# Patient Record
Sex: Male | Born: 1965 | Race: White | Hispanic: No | Smoking: Former smoker
Health system: Southern US, Community
[De-identification: ages and names within clinical notes are randomized; demographics above are authoritative.]

## PROBLEM LIST (undated history)

## (undated) DIAGNOSIS — E7800 Pure hypercholesterolemia, unspecified: Secondary | ICD-10-CM

## (undated) HISTORY — PX: BACK SURGERY: SHX140

---

## 2009-08-15 ENCOUNTER — Ambulatory Visit (HOSPITAL_COMMUNITY): Admission: RE | Admit: 2009-08-15 | Discharge: 2009-08-15 | Payer: Self-pay | Admitting: Family Medicine

## 2010-01-26 ENCOUNTER — Emergency Department (HOSPITAL_COMMUNITY): Admission: EM | Admit: 2010-01-26 | Discharge: 2010-01-26 | Payer: Self-pay | Admitting: Emergency Medicine

## 2014-10-04 ENCOUNTER — Other Ambulatory Visit: Payer: Self-pay | Admitting: Neurosurgery

## 2014-10-04 DIAGNOSIS — M48061 Spinal stenosis, lumbar region without neurogenic claudication: Secondary | ICD-10-CM

## 2014-10-13 ENCOUNTER — Ambulatory Visit
Admission: RE | Admit: 2014-10-13 | Discharge: 2014-10-13 | Disposition: A | Discharge status: Home / Self Care | Payer: Worker's Compensation | Source: Ambulatory Visit | Attending: Neurosurgery | Admitting: Neurosurgery

## 2014-10-13 ENCOUNTER — Ambulatory Visit
Admission: RE | Admit: 2014-10-13 | Discharge: 2014-10-13 | Disposition: A | Discharge status: Home / Self Care | Payer: Self-pay | Source: Ambulatory Visit | Attending: Neurosurgery | Admitting: Neurosurgery

## 2014-10-13 DIAGNOSIS — M48061 Spinal stenosis, lumbar region without neurogenic claudication: Secondary | ICD-10-CM

## 2014-10-13 MED ORDER — ONDANSETRON HCL 4 MG/2ML IJ SOLN
4.0000 mg | Freq: Four times a day (QID) | INTRAMUSCULAR | Status: DC | PRN
Start: 2014-10-13 — End: 2014-10-14

## 2014-10-13 MED ORDER — IOHEXOL 180 MG/ML  SOLN
20.0000 mL | Freq: Once | INTRAMUSCULAR | Status: AC | PRN
  Administered 2014-10-13: 20 mL via INTRATHECAL

## 2014-10-13 MED ORDER — DIAZEPAM 5 MG PO TABS
5.0000 mg | ORAL_TABLET | Freq: Once | ORAL | Status: AC
  Administered 2014-10-13: 5 mg via ORAL

## 2014-10-13 MED ORDER — DIAZEPAM 5 MG PO TABS
10.0000 mg | ORAL_TABLET | Freq: Once | ORAL | Status: AC
  Administered 2014-10-13: 5 mg via ORAL

## 2014-10-13 NOTE — Discharge Instructions (Signed)

## 2016-09-21 IMAGING — CT CT L SPINE W/ CM
4 of 11 series · 10 of 33 positions shown, 12 images · non-contrast
Comparison: Lumbar spine MRI 06/06/2014

CLINICAL DATA: Lumbar spinal stenosis. Right greater than left
lateral leg pain.
TECHNIQUE: Contiguous axial images were obtained through the Lumbar spine after
the intrathecal infusion of contrast. Coronal and sagittal
reconstructions were obtained of the axial image sets.

[Series 4: l spine bone · axial · 0.28mm/px · z∈[-59,+13]mm · 2 of 87 slices shown, 3 images]
[im 29/87  soft-tissue]
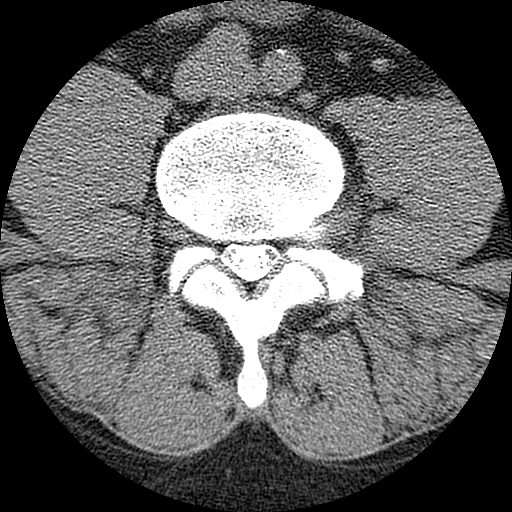
[im 29/87  bone]
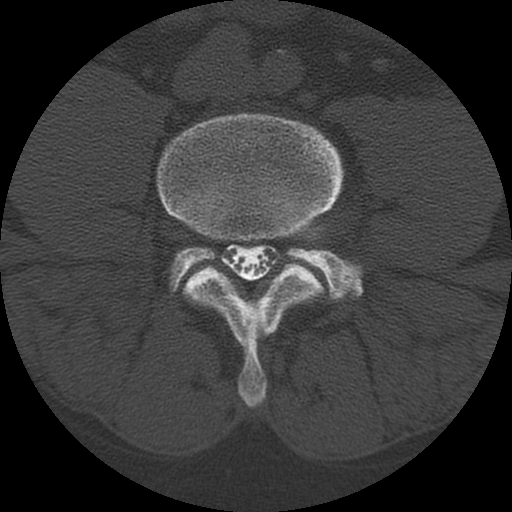
[im 58/87  bone]
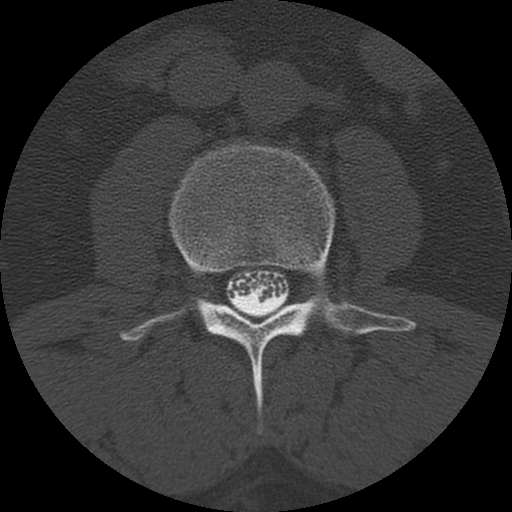

[Series 5: l spine detail · axial · 0.28mm/px · z∈[-59,+13]mm · 2 of 87 slices shown]
[im 29/87  bone]
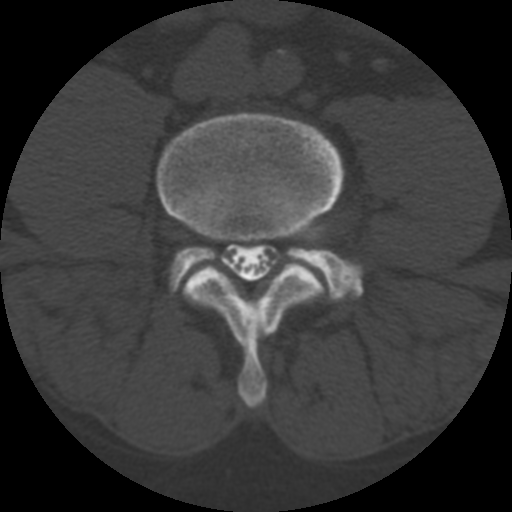
[im 58/87  bone]
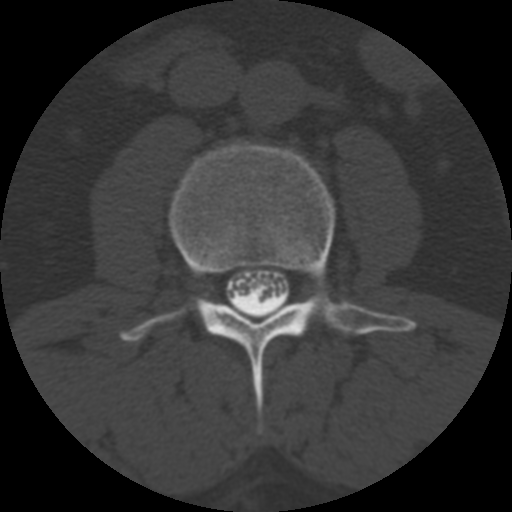

[Series 201: cor 2 · coronal · 0.43mm/px · 1 of 62 slices shown]
[im 31/62  bone]
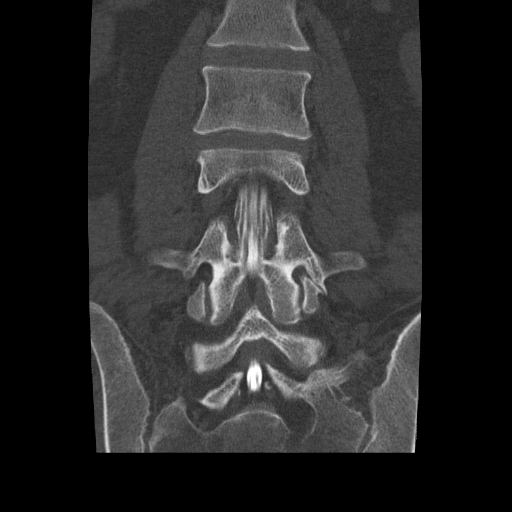

[Series 202: sag · sagittal · 0.43mm/px · 5 of 65 slices shown, 6 images]
[im 22/65  bone]
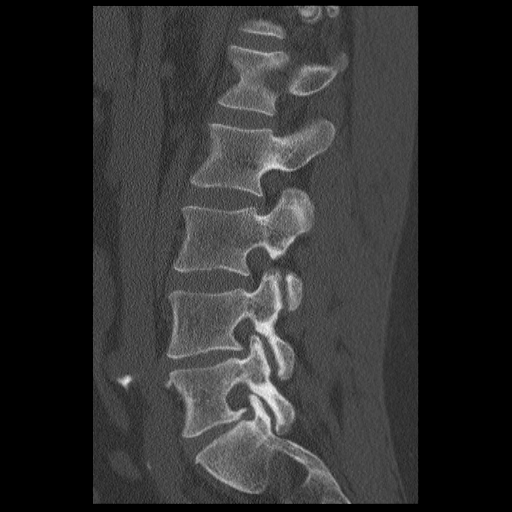
[im 27/65  bone]
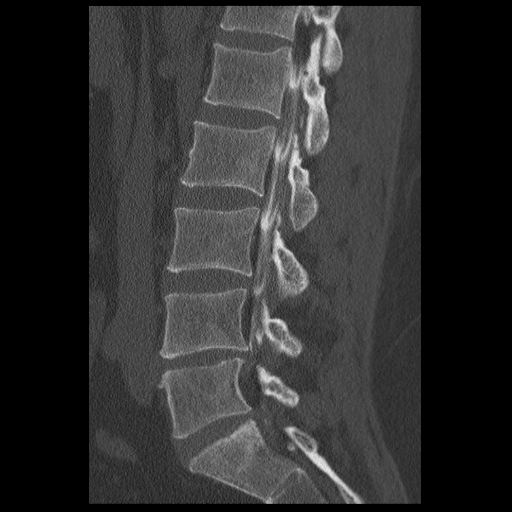
[im 33/65  soft-tissue]
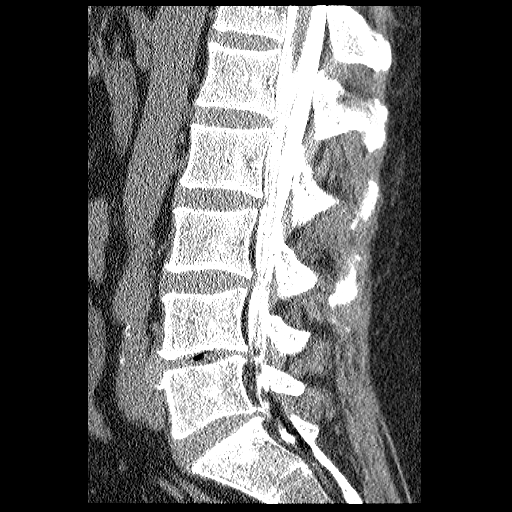
[im 33/65  bone]
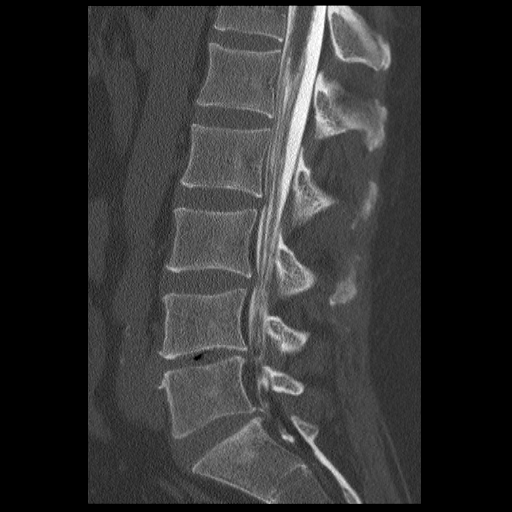
[im 38/65  bone]
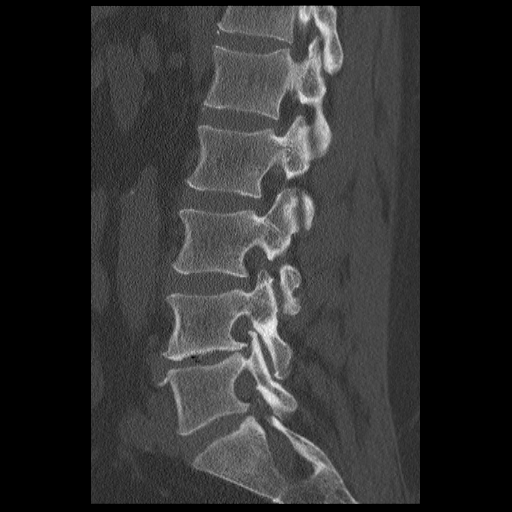
[im 43/65  bone]
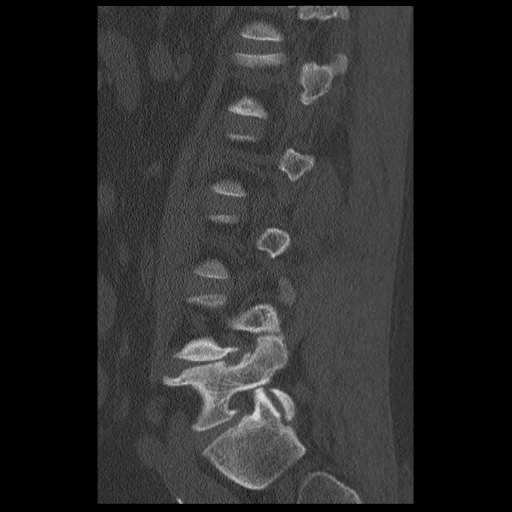

[10 of 33 positions shown; findings below may reference images not displayed]

EXAM:
LUMBAR MYELOGRAM

FLUOROSCOPY TIME:  Radiation Exposure Index (as provided by the
fluoroscopic device): 73 dGy*cm2

Fluoroscopy Time (in minutes and seconds):  1 minutes 30 seconds

PROCEDURE:
After thorough discussion of risks and benefits of the procedure
including bleeding, infection, injury to nerves, blood vessels,
adjacent structures as well as headache and CSF leak, written and
oral informed consent was obtained. Consent was obtained by Dr.
Chon Delorenzo. Time out form was completed.

Patient was positioned prone on the fluoroscopy table. Local
anesthesia was provided with 1% lidocaine without epinephrine after
prepped and draped in the usual sterile fashion. Puncture was
performed at L2-3 using a 3 1/2 inch 22-gauge spinal needle via a
right paramedian approach. Using a single pass through the dura, the
needle was placed within the thecal sac, with return of clear CSF.
15 mL of Mmnipaque-[id number] was injected into the thecal sac, with normal
opacification of the nerve roots and cauda equina consistent with
free flow within the subarachnoid space.

I personally performed the lumbar puncture and administered the
intrathecal contrast. I also personally supervised acquisition of
the myelogram images.
FINDINGS: LUMBAR MYELOGRAM FINDINGS:

Vertebral alignment is within normal limits without evidence of
significant listhesis with neutral, flexion, or extension imaging.
Vertebral body heights are preserved. There is moderate disc space
narrowing at L4-5, with mild narrowing at L5-S1. Small ventral
extradural defects at L3-4 and L4-5 result in mild spinal canal
narrowing. There is a small ventral extradural defect at L2-3
without evidence of significant stenosis. These do not significantly
change with standing.

CT LUMBAR MYELOGRAM FINDINGS:

There is minimal right convex curvature of the lower lumbar spine.
Trace retrolisthesis of L4 on L5 measures 3 mm, unchanged. Vertebral
body heights are preserved. Disc space narrowing is moderate at L4-5
and mild at L5-S1. Mild vacuum disc phenomenon is noted at L4-5.
Conus medullaris terminates at L1. Mild aortoiliac atherosclerotic
calcification is noted.

T12-L1 and L1-2:  Negative.

L2-3:  Minimal disc bulging without stenosis, unchanged.

L3-4: Small left central disc protrusion and mild facet and
ligamentum flavum hypertrophy result in minimal left lateral recess
and borderline spinal stenosis without neural foraminal stenosis,
unchanged.

L4-5: Disc space height loss, moderate circumferential disc bulging,
endplate spurring, and mild facet and ligamentum flavum hypertrophy
result in mild-to-moderate bilateral lateral recess stenosis, mild
spinal stenosis, and mild right and moderate left neural foraminal
stenosis, unchanged. The L5 nerve roots could be affected in the
lateral recesses.

L5-S1: Disc space height loss, mild disc bulging, and endplate
spurring result in mild bilateral neural foraminal stenosis without
spinal stenosis, unchanged. Bulging disc may contact the S1 nerve
roots in the lateral recesses without evidence of impingement.
IMPRESSION: 1. Moderate disc degeneration at L4-5 resulting in mild-to-moderate
lateral recess and neural foraminal stenosis and mild spinal
stenosis. The L5 nerves may be affected in the lateral recesses.
2. Mild bilateral neural foraminal stenosis at L5-S1 due to disc
degeneration.
3. Small disc protrusion at L3-4 without significant stenosis.

## 2019-03-31 ENCOUNTER — Other Ambulatory Visit: Payer: Self-pay

## 2019-03-31 ENCOUNTER — Ambulatory Visit
Admission: EM | Admit: 2019-03-31 | Discharge: 2019-03-31 | Disposition: A | Discharge status: Home / Self Care | Payer: Managed Care, Other (non HMO) | Attending: Emergency Medicine | Admitting: Emergency Medicine

## 2019-03-31 DIAGNOSIS — R519 Headache, unspecified: Secondary | ICD-10-CM

## 2019-03-31 DIAGNOSIS — R51 Headache: Secondary | ICD-10-CM

## 2019-03-31 MED ORDER — DEXAMETHASONE SODIUM PHOSPHATE 10 MG/ML IJ SOLN
10.0000 mg | Freq: Once | INTRAMUSCULAR | Status: AC
  Administered 2019-03-31: 10 mg via INTRAMUSCULAR

## 2019-03-31 MED ORDER — KETOROLAC TROMETHAMINE 60 MG/2ML IM SOLN
60.0000 mg | Freq: Once | INTRAMUSCULAR | Status: AC
  Administered 2019-03-31: 60 mg via INTRAMUSCULAR

## 2019-03-31 MED ORDER — ONDANSETRON 4 MG PO TBDP
4.0000 mg | ORAL_TABLET | Freq: Once | ORAL | Status: AC
  Administered 2019-03-31: 4 mg via ORAL

## 2019-03-31 NOTE — ED Provider Notes (Signed)
[facility]   OU:[id number] 03/31/19 Arrival Time: 51  CC: HEADACHE  SUBJECTIVE:  Gordon Herrera is a 53 y.o. male who complains of intermittent headaches for the past 2 weeks.  Denies a precipitating event, or recent head trauma.  Denies sick exposure to COVID, flu or strep.  Does admit to traveling by truck to [place] last week.  States he needs to update his eye glasses prescription, and has recently quit smoking cigarettes last week. Saw dentist and has sinus scan that were negative.  Patient localizes pain to the behind RT eye.  Describes the pain as intermittent and pulsating in character.  Pain is 9/10.  Patient has tried OTC advil with relief. Denies aggravating factors.  States headaches are more frequent than they have been in the past. Patient denies fever, chills, nausea, vomiting, nasal congestion, rhinorrhea, chest pain, cough, SOB, abdominal pain, weakness, numbness or tingling, slurred speech.    ROS: As per HPI.  All other pertinent ROS negative.     History reviewed. No pertinent past medical history. Past Surgical History:  Procedure Laterality Date  . BACK SURGERY     No Known Allergies No current facility-administered medications on file prior to encounter.    No current outpatient medications on file prior to encounter.   Social History   Socioeconomic History  . Marital status: Divorced    Spouse name: Not on file  . Number of children: Not on file  . Years of education: Not on file  . Highest education level: Not on file  Occupational History  . Not on file  Social Needs  . Financial resource strain: Not on file  . Food insecurity    Worry: Not on file    Inability: Not on file  . Transportation needs    Medical: Not on file    Non-medical: Not on file  Tobacco Use  . Smoking status: Former Research scientist (life sciences)  . Smokeless tobacco: Never Used  Substance and Sexual Activity  . Alcohol use: Not Currently  . Drug use: Never  . Sexual activity: Not  on file  Lifestyle  . Physical activity    Days per week: Not on file    Minutes per session: Not on file  . Stress: Not on file  Relationships  . Social Herbalist on phone: Not on file    Gets together: Not on file    Attends religious service: Not on file    Active member of club or organization: Not on file    Attends meetings of clubs or organizations: Not on file    Relationship status: Not on file  . Intimate partner violence    Fear of current or ex partner: Not on file    Emotionally abused: Not on file    Physically abused: Not on file    Forced sexual activity: Not on file  Other Topics Concern  . Not on file  Social History Narrative  . Not on file   No family history on file.  OBJECTIVE:  Vitals:   03/31/19 1337  BP: 124/86  Pulse: 74  Resp: 18  Temp: 98.5 F (36.9 C)  SpO2: 96%    General appearance: alert; no distress Eyes: PERRLA; EOMI HENT: normocephalic; atraumatic Neck: supple with FROM Lungs: clear to auscultation bilaterally Heart: regular rate and rhythm.  Radial pulses 2+ symmetrical bilaterally Extremities: no edema; symmetrical with no gross deformities Skin: warm and dry Neurologic: CN 2-12 grossly intact; finger  to nose without difficulty; normal gait; strength and sensation intact bilaterally about the upper and lower extremities; negative pronator drift Psychological: alert and cooperative; normal mood and affect   ASSESSMENT & PLAN:  1. Acute nonintractable headache, unspecified headache type     Meds ordered this encounter  Medications  . ketorolac (TORADOL) injection 60 mg  . dexamethasone (DECADRON) injection 10 mg  . ondansetron (ZOFRAN-ODT) disintegrating tablet 4 mg    Migraine cocktail given in office Rest and drink plenty of fluids Use OTC medications as needed for symptomatic relief Follow up with PCP to establish care and to reevaluate headaches at that time Return or go to the ER if you have any new or  worsening symptoms such as fever, chills, nausea, vomiting, chest pain, shortness of breath, cough, vision changes, worsening headache despite treatment, slurred speech, facial asymmetry, weakness in arms or legs, etc...  Reviewed expectations re: course of current medical issues. Questions answered. Outlined signs and symptoms indicating need for more acute intervention. Patient verbalized understanding. After Visit Summary given.   Lestine Box, PA-C 03/31/19 1411

## 2019-03-31 NOTE — ED Triage Notes (Signed)
Pt has had sever headaches off and on for past 2 weeks, just left dentist where he had sinus scan and to check implants

## 2019-03-31 NOTE — Discharge Instructions (Signed)
Migraine cocktail given in office Rest and drink plenty of fluids Use OTC medications as needed for symptomatic relief Follow up with PCP to establish care and to reevaluate headaches at that time Return or go to the ER if you have any new or worsening symptoms such as fever, chills, nausea, vomiting, chest pain, shortness of breath, cough, vision changes, worsening headache despite treatment, slurred speech, facial asymmetry, weakness in arms or legs, etc..Marland Kitchen

## 2019-04-21 ENCOUNTER — Encounter: Payer: Self-pay | Admitting: *Deleted

## 2019-05-07 ENCOUNTER — Other Ambulatory Visit: Payer: Self-pay | Admitting: *Deleted

## 2019-05-07 ENCOUNTER — Other Ambulatory Visit: Payer: Self-pay

## 2019-05-07 ENCOUNTER — Ambulatory Visit (INDEPENDENT_AMBULATORY_CARE_PROVIDER_SITE_OTHER): Payer: Managed Care, Other (non HMO) | Admitting: *Deleted

## 2019-05-07 DIAGNOSIS — Z1211 Encounter for screening for malignant neoplasm of colon: Secondary | ICD-10-CM

## 2019-05-07 MED ORDER — NA SULFATE-K SULFATE-MG SULF 17.5-3.13-1.6 GM/177ML PO SOLN: 1.0000 | Freq: Once | ORAL | 0 refills | Status: AC

## 2019-05-07 NOTE — Patient Instructions (Signed)
Gordon Herrera  [birth year] MRN: [id number]     Procedure Date: 08/03/2019 Time to register: 1:00 pm Place to register: [facility] Procedure Time: 2:00 pm Scheduled provider: Dr. Oneida Alar    PREPARATION FOR COLONOSCOPY WITH SUPREP BOWEL PREP KIT  Note: Suprep Bowel Prep Kit is a split-dose (2day) regimen. Consumption of BOTH 6-ounce bottles is required for a complete prep.  Please notify us immediately if you are diabetic, take iron supplements, or if you are on Coumadin or any other blood thinners.                                                                                                                                               1 DAY BEFORE PROCEDURE:  DATE: 08/02/2019  DAY: Sunday Continue clear liquids the entire day - NO SOLID FOOD.    At 6:00pm: Complete steps 1 through 4 below, using ONE (1) 6-ounce bottle, before going to bed. Step 1:  Pour ONE (1) 6-ounce bottle of SUPREP liquid into the mixing container.  Step 2:  Add cool drinking water to the 16 ounce line on the container and mix.  Note: Dilute the solution concentrate as directed prior to use. Step 3:  DRINK ALL the liquid in the container. Step 4:  You MUST drink an additional two (2) or more 16 ounce containers of water over the next one (1) hour.   Continue clear liquids.  DAY OF PROCEDURE:   DATE: 08/03/2019   DAY: Monday If you take medications for your heart, blood pressure, or breathing, you may take these medications.    5 hours before your procedure at :  9:00 am Step 1:  Pour ONE (1) 6-ounce bottle of SUPREP liquid into the mixing container.  Step 2:  Add cool drinking water to the 16 ounce line on the container and mix.  Note: Dilute the solution concentrate as directed prior to use. Step 3:  DRINK ALL the liquid in the container. Step 4:  You MUST drink an additional two (2) or more 16 ounce containers of water over the next one (1) hour. You MUST complete the final glass of water at  least 3 hours before your colonoscopy. Nothing by mouth past 11:00 am  You may take your morning medications with sip of water unless we have instructed otherwise.    Please see below for Dietary Information.  CLEAR LIQUIDS INCLUDE:  Water Jello (NOT red in color)   Ice Popsicles (NOT red in color)   Tea (sugar ok, no milk/cream) Powdered fruit flavored drinks  Coffee (sugar ok, no milk/cream) Gatorade/ Lemonade/ Kool-Aid  (NOT red in color)   Juice: apple, white grape, white cranberry Soft drinks  Clear bullion, consomme, broth (fat free beef/chicken/vegetable)  Carbonated beverages (any kind)  Strained chicken noodle soup Hard Candy   Remember: Clear liquids are  liquids that will allow you to see your fingers on the other side of a clear glass. Be sure liquids are NOT red in color, and not cloudy, but CLEAR.  DO NOT EAT OR DRINK ANY OF THE FOLLOWING:  Dairy products of any kind   Cranberry juice Tomato juice / V8 juice   Grapefruit juice Orange juice     Red grape juice  Do not eat any solid foods, including such foods as: cereal, oatmeal, yogurt, fruits, vegetables, creamed soups, eggs, bread, crackers, pureed foods in a blender, etc.   HELPFUL HINTS FOR DRINKING PREP SOLUTION:   Make sure prep is extremely cold. Mix and refrigerate the the morning of the prep. You may also put in the freezer.   You may try mixing some Crystal Light or Country Time Lemonade if you prefer. Mix in small amounts; add more if necessary.  Try drinking through a straw  Rinse mouth with water or a mouthwash between glasses, to remove after-taste.  Try sipping on a cold beverage /ice/ popsicles between glasses of prep.  Place a piece of sugar-free hard candy in mouth between glasses.  If you become nauseated, try consuming smaller amounts, or stretch out the time between glasses. Stop for 30-60 minutes, then slowly start back drinking.     OTHER INSTRUCTIONS  You will need a responsible  adult at least 52 years of age to accompany you and drive you home. This person must remain in the waiting room during your procedure. The hospital will cancel your procedure if you do not have a responsible adult with you.   1. Wear loose fitting clothing that is easily removed. 2. Leave jewelry and other valuables at home.  3. Remove all body piercing jewelry and leave at home. 4. Total time from sign-in until discharge is approximately 2-3 hours. 5. You should go home directly after your procedure and rest. You can resume normal activities the day after your procedure. 6. The day of your procedure you should not:  Drive  Make legal decisions  Operate machinery  Drink alcohol  Return to work   You may call the office (Dept: [phone]) before 5:00pm, or page the doctor on call [phone]) after 5:00pm, for further instructions, if necessary.   Insurance Information YOU WILL NEED TO CHECK WITH YOUR INSURANCE COMPANY FOR THE BENEFITS OF COVERAGE YOU HAVE FOR THIS PROCEDURE.  UNFORTUNATELY, NOT ALL INSURANCE COMPANIES HAVE BENEFITS TO COVER ALL OR PART OF THESE TYPES OF PROCEDURES.  IT IS YOUR RESPONSIBILITY TO CHECK YOUR BENEFITS, HOWEVER, WE WILL BE GLAD TO ASSIST YOU WITH ANY CODES YOUR INSURANCE COMPANY MAY NEED.    PLEASE NOTE THAT MOST INSURANCE COMPANIES WILL NOT COVER A SCREENING COLONOSCOPY FOR PEOPLE UNDER THE AGE OF 50  IF YOU HAVE BCBS INSURANCE, YOU MAY HAVE BENEFITS FOR A SCREENING COLONOSCOPY BUT IF POLYPS ARE FOUND THE DIAGNOSIS WILL CHANGE AND THEN YOU MAY HAVE A DEDUCTIBLE THAT WILL NEED TO BE MET. SO PLEASE MAKE SURE YOU CHECK YOUR BENEFITS FOR A SCREENING COLONOSCOPY AS WELL AS A DIAGNOSTIC COLONOSCOPY.

## 2019-05-07 NOTE — Progress Notes (Addendum)
Gastroenterology Pre-Procedure Review  Request Date: 05/07/2019 Requesting Physician: Dr. Gerarda Fraction, no previous TCS  PATIENT REVIEW QUESTIONS: The patient responded to the following health history questions as indicated:    1. Diabetes Melitis: no 2. Joint replacements in the past 12 months: no 3. Major health problems in the past 3 months: yes, migraines that are being managed by PCP 4. Has an artificial valve or MVP: no 5. Has a defibrillator: no 6. Has been advised in past to take antibiotics in advance of a procedure like teeth cleaning: no 7. Family history of colon cancer: no  8. Alcohol Use: yes, 4 or 5 glasses of bourbon once or twice a month 9. Illicit drug Use: no 10. History of sleep apnea: no  11. History of coronary artery or other vascular stents placed within the last 12 months: no 12. History of any prior anesthesia complications: no 13. There is no height or weight on file to calculate BMI.ht: 6'0 wt: 212 lbs    MEDICATIONS & ALLERGIES:    Patient reports the following regarding taking any blood thinners:   Plavix? no Aspirin? no Coumadin? no Brilinta? no Xarelto? no Eliquis? no Pradaxa? no Savaysa? no Effient? no  Patient confirms/reports the following medications:  Current Outpatient Medications  Medication Sig Dispense Refill  . atorvastatin (LIPITOR) 40 MG tablet Take 40 mg by mouth daily.    . clotrimazole-betamethasone (LOTRISONE) cream Apply 1 application topically 2 (two) times daily.    Marland Kitchen MAGNESIUM PO Take by mouth daily.    . Multiple Vitamins-Minerals (CENTRUM ADULTS PO) Take by mouth daily.    Marland Kitchen venlafaxine XR (EFFEXOR-XR) 75 MG 24 hr capsule Take 75 mg by mouth daily.     No current facility-administered medications for this visit.     Patient confirms/reports the following allergies:  No Known Allergies  No orders of the defined types were placed in this encounter.   AUTHORIZATION INFORMATION Primary Insurance: [place],  [place] #: [id number],   Group [id number] Pre-Cert / Josem Kaufmann required: No, not required per Heron Nay / Auth #: [id number]  SCHEDULE INFORMATION: Procedure has been scheduled as follows:  Date:08/03/2019 , Time: 2:00 Location: [facility] with Dr. Oneida Alar  This Gastroenterology Pre-Precedure Review Form is being routed to the following provider(s): Walden Field, NP

## 2019-05-07 NOTE — Addendum Note (Signed)
Addended by: Metro Kung on: 05/07/2019 03:34 PM   Modules accepted: Orders, SmartSet

## 2019-05-07 NOTE — Progress Notes (Signed)
Ok to schedule.  Please add 12.5 mg pre-procedure Phenergan

## 2019-07-30 ENCOUNTER — Telehealth: Payer: Self-pay | Admitting: *Deleted

## 2019-07-30 NOTE — Telephone Encounter (Signed)
Called patient. He was agreeable to moving procedure time to 12:00pm for date 1/11. Patient aware to arrive at 11:00am. He will start drinking 2nd half of prep at 7:00am, npo 9:00am. Called endo and made aware

## 2019-07-31 ENCOUNTER — Other Ambulatory Visit: Payer: Self-pay

## 2019-07-31 ENCOUNTER — Other Ambulatory Visit (HOSPITAL_COMMUNITY)
Admission: RE | Admit: 2019-07-31 | Discharge: 2019-07-31 | Disposition: A | Discharge status: Home / Self Care | Payer: Managed Care, Other (non HMO) | Source: Ambulatory Visit | Attending: Gastroenterology | Admitting: Gastroenterology

## 2019-07-31 DIAGNOSIS — Z01812 Encounter for preprocedural laboratory examination: Secondary | ICD-10-CM | POA: Diagnosis present

## 2019-07-31 DIAGNOSIS — Z20822 Contact with and (suspected) exposure to covid-19: Secondary | ICD-10-CM | POA: Insufficient documentation

## 2019-07-31 LAB — SARS CORONAVIRUS 2 (TAT 6-24 HRS): SARS Coronavirus 2: NEGATIVE

## 2019-08-03 ENCOUNTER — Encounter (HOSPITAL_COMMUNITY)
Admission: RE | Disposition: A | Discharge status: Home / Self Care | Payer: Self-pay | Source: Home / Self Care | Attending: Gastroenterology

## 2019-08-03 ENCOUNTER — Ambulatory Visit (HOSPITAL_COMMUNITY)
Admission: RE | Admit: 2019-08-03 | Discharge: 2019-08-03 | Disposition: A | Discharge status: Home / Self Care | Payer: Managed Care, Other (non HMO) | Attending: Gastroenterology | Admitting: Gastroenterology

## 2019-08-03 ENCOUNTER — Encounter (HOSPITAL_COMMUNITY): Payer: Self-pay | Admitting: Gastroenterology

## 2019-08-03 ENCOUNTER — Other Ambulatory Visit: Payer: Self-pay

## 2019-08-03 DIAGNOSIS — Z1211 Encounter for screening for malignant neoplasm of colon: Secondary | ICD-10-CM | POA: Diagnosis present

## 2019-08-03 DIAGNOSIS — K644 Residual hemorrhoidal skin tags: Secondary | ICD-10-CM | POA: Insufficient documentation

## 2019-08-03 DIAGNOSIS — D123 Benign neoplasm of transverse colon: Secondary | ICD-10-CM | POA: Diagnosis not present

## 2019-08-03 DIAGNOSIS — K648 Other hemorrhoids: Secondary | ICD-10-CM | POA: Diagnosis not present

## 2019-08-03 DIAGNOSIS — Z87891 Personal history of nicotine dependence: Secondary | ICD-10-CM | POA: Diagnosis not present

## 2019-08-03 DIAGNOSIS — D120 Benign neoplasm of cecum: Secondary | ICD-10-CM | POA: Diagnosis not present

## 2019-08-03 DIAGNOSIS — Z79899 Other long term (current) drug therapy: Secondary | ICD-10-CM | POA: Diagnosis not present

## 2019-08-03 DIAGNOSIS — Q438 Other specified congenital malformations of intestine: Secondary | ICD-10-CM | POA: Insufficient documentation

## 2019-08-03 DIAGNOSIS — K635 Polyp of colon: Secondary | ICD-10-CM

## 2019-08-03 DIAGNOSIS — E7800 Pure hypercholesterolemia, unspecified: Secondary | ICD-10-CM | POA: Diagnosis not present

## 2019-08-03 HISTORY — PX: POLYPECTOMY: SHX5525

## 2019-08-03 HISTORY — DX: Pure hypercholesterolemia, unspecified: E78.00

## 2019-08-03 HISTORY — PX: COLONOSCOPY: SHX5424

## 2019-08-03 SURGERY — COLONOSCOPY
Anesthesia: Moderate Sedation

## 2019-08-03 MED ORDER — SODIUM CHLORIDE FLUSH 0.9 % IV SOLN
INTRAVENOUS | Status: AC
  Filled 2019-08-03: qty 10

## 2019-08-03 MED ORDER — MIDAZOLAM HCL 5 MG/5ML IJ SOLN
INTRAMUSCULAR | Status: AC
  Filled 2019-08-03: qty 10

## 2019-08-03 MED ORDER — MEPERIDINE HCL 100 MG/ML IJ SOLN
INTRAMUSCULAR | Status: DC | PRN
  Administered 2019-08-03 (×2): 25 mg via INTRAVENOUS

## 2019-08-03 MED ORDER — PROMETHAZINE HCL 25 MG/ML IJ SOLN
INTRAMUSCULAR | Status: AC
  Filled 2019-08-03: qty 1

## 2019-08-03 MED ORDER — MIDAZOLAM HCL 5 MG/5ML IJ SOLN
INTRAMUSCULAR | Status: DC | PRN
  Administered 2019-08-03: 2 mg via INTRAVENOUS
  Administered 2019-08-03: 1 mg via INTRAVENOUS

## 2019-08-03 MED ORDER — SODIUM CHLORIDE 0.9 % IV SOLN: INTRAVENOUS | Status: DC

## 2019-08-03 MED ORDER — MEPERIDINE HCL 100 MG/ML IJ SOLN
INTRAMUSCULAR | Status: AC
  Filled 2019-08-03: qty 2

## 2019-08-03 MED ORDER — PROMETHAZINE HCL 25 MG/ML IJ SOLN
12.5000 mg | Freq: Once | INTRAMUSCULAR | Status: AC
  Administered 2019-08-03: 12.5 mg via INTRAVENOUS

## 2019-08-03 NOTE — H&P (Signed)
Primary Care Physician:  Redmond School, MD Primary Gastroenterologist:  Dr. Oneida Alar  Pre-Procedure History & Physical: HPI:  Gordon Herrera is a 54 y.o. male here for COLON CANCER SCREENING.  Past Medical History:  Diagnosis Date  . Hypercholesteremia     Past Surgical History:  Procedure Laterality Date  . BACK SURGERY      Prior to Admission medications   Medication Sig Start Date End Date Taking? Authorizing Provider  acetaminophen (TYLENOL) 500 MG tablet Take 500-1,000 mg by mouth every 6 (six) hours as needed for headache.   Yes [provider]  atorvastatin (LIPITOR) 40 MG tablet Take 40 mg by mouth daily.   Yes [provider]  clotrimazole-betamethasone (LOTRISONE) cream Apply 1 application topically 2 (two) times daily as needed (irritation.).    Yes [provider]  Multiple Vitamin (MULTIVITAMIN WITH MINERALS) TABS tablet Take 1 tablet by mouth daily.   Yes [provider]  SUPREP BOWEL PREP KIT 17.5-3.13-1.6 GM/177ML SOLN Take 354 mLs by mouth once. 05/07/19  Yes [provider]  venlafaxine XR (EFFEXOR-XR) 75 MG 24 hr capsule Take 75 mg by mouth at bedtime.    Yes [provider]  SUMAtriptan (IMITREX) 50 MG tablet Take 50-100 mg by mouth daily as needed for headache. 05/02/19   [provider]    Allergies as of 05/07/2019  . (No Known Allergies)    History reviewed. No pertinent family history.  Social History   Socioeconomic History  . Marital status: Divorced    Spouse name: Not on file  . Number of children: Not on file  . Years of education: Not on file  . Highest education level: Not on file  Occupational History  . Not on file  Tobacco Use  . Smoking status: Former Research scientist (life sciences)  . Smokeless tobacco: Never Used  Substance and Sexual Activity  . Alcohol use: Not Currently  . Drug use: Never  . Sexual activity: Not on file  Other Topics Concern  . Not on file  Social History Narrative   . Not on file   Social Determinants of Health   Financial Resource Strain:   . Difficulty of Paying Living Expenses: Not on file  Food Insecurity:   . Worried About Charity fundraiser in the Last Year: Not on file  . Ran Out of Food in the Last Year: Not on file  Transportation Needs:   . Lack of Transportation (Medical): Not on file  . Lack of Transportation (Non-Medical): Not on file  Physical Activity:   . Days of Exercise per Week: Not on file  . Minutes of Exercise per Session: Not on file  Stress:   . Feeling of Stress : Not on file  Social Connections:   . Frequency of Communication with Friends and Family: Not on file  . Frequency of Social Gatherings with Friends and Family: Not on file  . Attends Religious Services: Not on file  . Active Member of Clubs or Organizations: Not on file  . Attends Archivist Meetings: Not on file  . Marital Status: Not on file  Intimate Partner Violence:   . Fear of Current or Ex-Partner: Not on file  . Emotionally Abused: Not on file  . Physically Abused: Not on file  . Sexually Abused: Not on file    Review of Systems: See HPI, otherwise negative ROS   Physical Exam: BP 117/86   Pulse 63   Temp 99 F (37.2 C) (Oral)  Resp 18   Ht 6' (1.829 m)   SpO2 96%  General:   Alert,  pleasant and cooperative in NAD Head:  Normocephalic and atraumatic. Neck:  Supple; Lungs:  Clear throughout to auscultation.    Heart:  Regular rate and rhythm. Abdomen:  Soft, nontender and nondistended. Normal bowel sounds, without guarding, and without rebound.   Neurologic:  Alert and  oriented x4;  grossly normal neurologically.  Impression/Plan:    SCREENING  Plan:  1. TCS TODAY DISCUSSED PROCEDURE, BENEFITS, & RISKS: < 1% chance of medication reaction, bleeding, perforation, ASPIRATION, or rupture of spleen/liver requiring surgery to fix it and missed polyps < 1 cm 10-20% of the time.

## 2019-08-03 NOTE — Discharge Instructions (Signed)
You had 4 polyp removed. You have internal and external hemorrhoids.   DRINK WATER TO KEEP YOUR URINE LIGHT YELLOW.  FOLLOW A HIGH FIBER DIET. AVOID ITEMS THAT CAUSE BLOATING & GAS. SEE INFO BELOW.   USE PREPARATION H FOUR TIMES  A DAY IF NEEDED TO RELIEVE RECTAL PAIN/PRESSURE/BLEEDING.\   YOUR BIOPSY RESULTS WILL BE BACK IN 5 BUSINESS DAYS.  Next colonoscopy in 3 years.    Colonoscopy Care After Read the instructions outlined below and refer to this sheet in the next week. These discharge instructions provide you with general information on caring for yourself after you leave the hospital. While your treatment has been planned according to the most current medical practices available, unavoidable complications occasionally occur. If you have any problems or questions after discharge, call DR. Reshonda Koerber, [phone].  ACTIVITY  You may resume your regular activity, but move at a slower pace for the next 24 hours.   Take frequent rest periods for the next 24 hours.   Walking will help get rid of the air and reduce the bloated feeling in your belly (abdomen).   No driving for 24 hours (because of the medicine (anesthesia) used during the test).   You may shower.   Do not sign any important legal documents or operate any machinery for 24 hours (because of the anesthesia used during the test).    NUTRITION  Drink plenty of fluids.   You may resume your normal diet as instructed by your doctor.   Begin with a light meal and progress to your normal diet. Heavy or fried foods are harder to digest and may make you feel sick to your stomach (nauseated).   Avoid alcoholic beverages for 24 hours or as instructed.    MEDICATIONS  You may resume your normal medications.   WHAT YOU CAN EXPECT TODAY  Some feelings of bloating in the abdomen.   Passage of more gas than usual.   Spotting of blood in your stool or on the toilet paper  .  IF YOU HAD POLYPS REMOVED DURING THE  COLONOSCOPY:  Eat a soft diet IF YOU HAVE NAUSEA, BLOATING, ABDOMINAL PAIN, OR VOMITING.    FINDING OUT THE RESULTS OF YOUR TEST Not all test results are available during your visit. DR. Oneida Alar WILL CALL YOU WITHIN 14 DAYS OF YOUR PROCEDUE WITH YOUR RESULTS. Do not assume everything is normal if you have not heard from DR. Teria Khachatryan, CALL HER OFFICE AT [phone].  SEEK IMMEDIATE MEDICAL ATTENTION AND CALL THE OFFICE: [phone] IF:  You have more than a spotting of blood in your stool.   Your belly is swollen (abdominal distention).   You are nauseated or vomiting.   You have a temperature over 101F.   You have abdominal pain or discomfort that is severe or gets worse throughout the day.   High-Fiber Diet A high-fiber diet changes your normal diet to include more whole grains, legumes, fruits, and vegetables. Changes in the diet involve replacing refined carbohydrates with unrefined foods. The calorie level of the diet is essentially unchanged. The Dietary Reference Intake (recommended amount) for adult males is 38 grams per day. For adult females, it is 25 grams per day. Pregnant and lactating women should consume 28 grams of fiber per day. Fiber is the intact part of a plant that is not broken down during digestion. Functional fiber is fiber that has been isolated from the plant to provide a beneficial effect in the body.  PURPOSE Increase stool bulk.  Ease and regulate bowel movements.  Lower cholesterol.  REDUCE RISK OF COLON CANCER  INDICATIONS THAT YOU NEED MORE FIBER Constipation and hemorrhoids.  Uncomplicated diverticulosis (intestine condition) and irritable bowel syndrome.  Weight management.  As a protective measure against hardening of the arteries (atherosclerosis), diabetes, and cancer.   GUIDELINES FOR INCREASING FIBER IN THE DIET Start adding fiber to the diet slowly. A gradual increase of about 5 more grams (2 servings of most fruits or vegetables) per day is  best. Too rapid an increase in fiber may result in constipation, flatulence, and bloating.  Drink enough water and fluids to keep your urine clear or pale yellow. Water, juice, or caffeine-free drinks are recommended. Not drinking enough fluid may cause constipation.  Eat a variety of high-fiber foods rather than one type of fiber.  Try to increase your intake of fiber through using high-fiber foods rather than fiber pills or supplements that contain small amounts of fiber.  The goal is to change the types of food eaten. Do not supplement your present diet with high-fiber foods, but replace foods in your present diet.    Polyps, Colon  A polyp is extra tissue that grows inside your body. Colon polyps grow in the large intestine. The large intestine, also called the colon, is part of your digestive system. It is a long, hollow tube at the end of your digestive tract where your body makes and stores stool. Most polyps are not dangerous. They are benign. This means they are not cancerous. But over time, some types of polyps can turn into cancer. Polyps that are smaller than a pea are usually not harmful. But larger polyps could someday become or may already be cancerous. To be safe, doctors remove all polyps and test them.   WHO GETS POLYPS? Anyone can get polyps, but certain people are more likely than others. You may have a greater chance of getting polyps if:  You are over 50.   You have had polyps before.   Someone in your family has had polyps.   Someone in your family has had cancer of the large intestine.   Find out if someone in your family has had polyps. You may also be more likely to get polyps if you:   Eat a lot of fatty foods   Smoke   Drink alcohol   Do not exercise  Eat too much   PREVENTION There is not one sure way to prevent polyps. You might be able to lower your risk of getting them if you:  Eat more fruits and vegetables and less fatty food.   Do not smoke.    Avoid alcohol.   Exercise every day.   Lose weight if you are overweight.   Eating more calcium and folate can also lower your risk of getting polyps. Some foods that are rich in calcium are milk, cheese, and broccoli. Some foods that are rich in folate are chickpeas, kidney beans, and spinach.

## 2019-08-03 NOTE — Op Note (Signed)
[facility] Patient Name: Gordon Herrera Procedure Date: 08/03/2019 12:03 PM MRN: [id number] Date of Birth: 1965-11-08 Attending MD: Barney Drain MD, MD CSN: [id number] Age: 54 Admit Type: Outpatient Procedure:                Colonoscopywith COLD SNARE POLYPECTOMY Indications:              Screening for colorectal malignant neoplasm Providers:                Barney Drain MD, MD, Hinton Rao, RN, Randa Spike, Technician Referring MD:             [employer], MD Medicines:                Promethazine 12.5 mg IV, Meperidine 50 mg IV,                            Midazolam 3 mg IV Complications:            No immediate complications. Estimated Blood Loss:     Estimated blood loss was minimal. Procedure:                Pre-Anesthesia Assessment:                           - Prior to the procedure, a History and Physical                            was performed, and patient medications and                            allergies were reviewed. The patient's tolerance of                            previous anesthesia was also reviewed. The risks                            and benefits of the procedure and the sedation                            options and risks were discussed with the patient.                            All questions were answered, and informed consent                            was obtained. Prior Anticoagulants: The patient has                            taken no previous anticoagulant or antiplatelet                            agents. ASA Grade Assessment: II - A patient with  mild systemic disease. After reviewing the risks                            and benefits, the patient was deemed in                            satisfactory condition to undergo the procedure.                            After obtaining informed consent, the colonoscope                            was passed under direct vision. Throughout the                             procedure, the patient's blood pressure, pulse, and                            oxygen saturations were monitored continuously. The                            [id number]) scope was introduced through                            the anus and advanced to the the cecum, identified                            by appendiceal orifice and ileocecal valve. The                            colonoscopy was somewhat difficult due to a                            tortuous colon and the patient's agitation.                            Successful completion of the procedure was aided by                            increasing the dose of sedation medication,                            straightening and shortening the scope to obtain                            bowel loop reduction and COLOWRAP. The patient                            tolerated the procedure fairly well. The quality of                            the bowel preparation was excellent. The ileocecal  valve, appendiceal orifice, and rectum were                            photographed. Scope In: 12:48:06 PM Scope Out: 1:09:44 PM Scope Withdrawal Time: 0 hours 19 minutes 9 seconds  Total Procedure Duration: 0 hours 21 minutes 38 seconds  Findings:      Four sessile polyps were found in the proximal transverse colon, mid       transverse colon, distal transverse colon and cecum. The polyps were 2       to 5 mm in size. These polyps were removed with a cold snare. Resection       and retrieval were complete.      External and internal hemorrhoids were found.      The recto-sigmoid colon and sigmoid colon were mildly tortuous. Impression:               - Four 2 to 5 mm polyps in the proximal transverse                            colon, in the mid transverse colon, in the distal                            transverse colon and in the cecum, removed with a                            cold snare.  Resected and retrieved.                           - External and internal hemorrhoids.                           - Tortuous colon. Moderate Sedation:      Moderate (conscious) sedation was administered by the endoscopy nurse       and supervised by the endoscopist. The following parameters were       monitored: oxygen saturation, heart rate, blood pressure, and response       to care. Total physician intraservice time was 34 minutes. Recommendation:           - Patient has a contact number available for                            emergencies. The signs and symptoms of potential                            delayed complications were discussed with the                            patient. Return to normal activities tomorrow.                            Written discharge instructions were provided to the                            patient.                           -  High fiber diet.                           - Continue present medications.                           - Await pathology results.                           - Repeat colonoscopy in 3 years for surveillance. Procedure Code(s):        --- Professional ---                           [phone], Colonoscopy, flexible; with removal of                            tumor(s), polyp(s), or other lesion(s) by snare                            technique                           99153, Moderate sedation; each additional 15                            minutes intraservice time                           G0500, Moderate sedation services provided by the                            same physician or other qualified health care                            professional performing a gastrointestinal                            endoscopic service that sedation supports,                            requiring the presence of an independent trained                            observer to assist in the monitoring of the                            patient's level of  consciousness and physiological                            status; initial 15 minutes of intra-service time;                            patient age 64 years or older (additional time may                            be reported with [phone], as  appropriate) Diagnosis Code(s):        --- Professional ---                           Z12.11, Encounter for screening for malignant                            neoplasm of colon                           K63.5, Polyp of colon                           K64.8, Other hemorrhoids                           Q43.8, Other specified congenital malformations of                            intestine CPT copyright 2019 [employer]. All rights reserved. The codes documented in this report are preliminary and upon coder review may  be revised to meet current compliance requirements. Barney Drain, MD Barney Drain MD, MD 08/03/2019 1:27:20 PM This report has been signed electronically. Number of Addenda: 0

## 2019-08-04 LAB — SURGICAL PATHOLOGY

## 2019-08-05 ENCOUNTER — Telehealth: Payer: Self-pay | Admitting: Gastroenterology

## 2019-08-05 NOTE — Telephone Encounter (Signed)
Reminder in epic °

## 2019-08-05 NOTE — Telephone Encounter (Signed)
Please call pt. He had 4 simple adenomas removed from his colon.   DRINK WATER TO KEEP YOUR URINE LIGHT YELLOW. FOLLOW A HIGH FIBER DIET. AVOID ITEMS THAT CAUSE BLOATING & GAS.  USE PREPARATION H FOUR TIMES  A DAY IF NEEDED TO RELIEVE RECTAL PAIN/PRESSURE/BLEEDING.  Next colonoscopy in 3 years.

## 2019-08-06 NOTE — Telephone Encounter (Signed)
Pt is aware of results and plan.

## 2022-01-06 ENCOUNTER — Encounter: Payer: Self-pay | Admitting: Emergency Medicine

## 2022-01-06 ENCOUNTER — Other Ambulatory Visit: Payer: Self-pay

## 2022-01-06 ENCOUNTER — Ambulatory Visit
Admission: EM | Admit: 2022-01-06 | Discharge: 2022-01-06 | Disposition: A | Discharge status: Home / Self Care | Payer: BC Managed Care – PPO | Attending: Family Medicine | Admitting: Family Medicine

## 2022-01-06 DIAGNOSIS — L03116 Cellulitis of left lower limb: Secondary | ICD-10-CM

## 2022-01-06 MED ORDER — SULFAMETHOXAZOLE-TRIMETHOPRIM 800-160 MG PO TABS: 1.0000 | ORAL_TABLET | Freq: Two times a day (BID) | ORAL | 0 refills | Status: AC

## 2022-01-06 MED ORDER — CEFTRIAXONE SODIUM 1 G IJ SOLR
1.0000 g | Freq: Once | INTRAMUSCULAR | Status: AC
  Administered 2022-01-06: 1 g via INTRAMUSCULAR

## 2022-01-06 NOTE — Discharge Instructions (Addendum)
Meds ordered this encounter  Medications   cefTRIAXone (ROCEPHIN) injection 1 g   sulfamethoxazole-trimethoprim (BACTRIM DS) 800-160 MG tablet    Sig: Take 1 tablet by mouth 2 (two) times daily for 10 days.    Dispense:  20 tablet    Refill:  0

## 2022-01-06 NOTE — ED Triage Notes (Signed)
Pt reports noticed "sore" to inner left ankle x3 weeks ago. Pt reports site began to itch and reports discoloration,scab, and swelling began. Pt reports last several days "scab" fell off and continued to scratch and reports increase in left leg/ankle swelling.   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
